# Patient Record
Sex: Female | Born: 1955 | Race: Black or African American | Hispanic: No | Marital: Married | Smoking: Never smoker
Health system: Southern US, Community
[De-identification: ages and names within clinical notes are randomized; demographics above are authoritative.]

## PROBLEM LIST (undated history)

## (undated) DIAGNOSIS — M791 Myalgia, unspecified site: Secondary | ICD-10-CM

## (undated) HISTORY — PX: KNEE SURGERY: SHX244

## (undated) HISTORY — DX: Myalgia, unspecified site: M79.10

---

## 2000-02-19 ENCOUNTER — Encounter (HOSPITAL_BASED_OUTPATIENT_CLINIC_OR_DEPARTMENT_OTHER): Payer: Self-pay | Admitting: Internal Medicine

## 2000-02-19 ENCOUNTER — Encounter: Admission: RE | Admit: 2000-02-19 | Discharge: 2000-02-19 | Payer: Self-pay

## 2002-11-19 ENCOUNTER — Inpatient Hospital Stay (HOSPITAL_COMMUNITY): Admission: EM | Admit: 2002-11-19 | Discharge: 2002-11-22 | Payer: Self-pay | Admitting: Emergency Medicine

## 2003-04-12 ENCOUNTER — Emergency Department (HOSPITAL_COMMUNITY): Admission: EM | Admit: 2003-04-12 | Discharge: 2003-04-12 | Payer: Self-pay | Admitting: Family Medicine

## 2004-04-14 ENCOUNTER — Emergency Department (HOSPITAL_COMMUNITY): Admission: EM | Admit: 2004-04-14 | Discharge: 2004-04-14 | Payer: Self-pay | Admitting: Emergency Medicine

## 2007-11-26 ENCOUNTER — Encounter: Admission: RE | Admit: 2007-11-26 | Discharge: 2007-11-26 | Payer: Self-pay | Admitting: General Surgery

## 2007-11-29 ENCOUNTER — Ambulatory Visit (HOSPITAL_BASED_OUTPATIENT_CLINIC_OR_DEPARTMENT_OTHER): Admission: RE | Admit: 2007-11-29 | Discharge: 2007-11-29 | Payer: Self-pay | Admitting: General Surgery

## 2007-11-29 ENCOUNTER — Encounter (INDEPENDENT_AMBULATORY_CARE_PROVIDER_SITE_OTHER): Payer: Self-pay | Admitting: General Surgery

## 2007-12-06 ENCOUNTER — Emergency Department (HOSPITAL_COMMUNITY): Admission: EM | Admit: 2007-12-06 | Discharge: 2007-12-06 | Payer: Self-pay | Admitting: Emergency Medicine

## 2008-01-18 ENCOUNTER — Encounter: Admission: RE | Admit: 2008-01-18 | Discharge: 2008-01-18 | Payer: Self-pay | Admitting: Rheumatology

## 2010-05-21 NOTE — Op Note (Signed)
Belinda Dougherty, Belinda Dougherty NO.:  [id number]   MEDICAL RECORD NO.:  [id number]          PATIENT TYPE:  AMB   LOCATION:  [facility]                         FACILITY:  [facility]   PHYSICIAN:  Anselm Pancoast. Weatherly, M.D.DATE OF BIRTH:  June 24, 1955   DATE OF PROCEDURE:  11/29/2007  DATE OF DISCHARGE:                               OPERATIVE REPORT   PREOPERATIVE DIAGNOSES:  Diffuse muscle weakness with elevated CPK.  Muscle biopsy recommended.   POSTOPERATIVE DIAGNOSES:  Diffuse muscle weakness with elevated CPK.  Muscle biopsy recommended.   OPERATION:  Right muscle biopsy of right quadriceps, right thigh.   General anesthesia.   HISTORY:  Belinda Dougherty is a 55 year old moderately overweight female who  was referred to me by Vania Rea. Jarold Motto, MD, Clementeen Graham, FACP, FAGA and  Pollyann Savoy, M.D. for a right muscle biopsy.  I saw the patient  back in late August.  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  This has been a problem that has progressed over the  last  6 months or so, kind of chronic pain and progressive muscle weakness.  She had marked elevated CPK and had been worked up by the neurologist  and Pollyann Savoy, M.D. with inconclusive diagnosis.  A muscle biopsy  was recommended and the patient desired to wait until this time since  she had just started school and she wanted to wait so she could be off  Thanksgiving week to miss as little work as possible.  Michael L.  Reynolds, M.D. had recommended that the right quadriceps be the muscle  biopsied because of findings on the leg EMG and she is here today for  the planned procedure.  There has been no actual rapid progression of  her disease.   Her laboratory studies, I think electrolytes on a CBC were repeated and  there was nothing significantly abnormal.   The patient was taken to the operative suite and no antibiotics were  given and of course we had marked the right leg.  The leg itself was  prepped with Betadine surgical scrub and solution and draped in a  sterile manner.  I made a little incision about 2 inches or slightly  longer in length.  Sharp dissection down through the adipose tissue and  the Scarpa fascia and the fascia overlying the quadriceps and then the  muscle itself looks grossly normal.  She has certainly got a mild  atrophy of the thigh and I picked up of a piece of the muscle inferior.  I had put a 2-0 Vicryl suture distally for hemostasis and then using a  Kelly transected that with scissors and then elevated a significant,  probably an inch wide and 3/4 of an inch in thickness and then elevated  it and then transected it up proximally and then placed some 4-0 Vicryl  and 3-0 Vicryl sutures after the muscle had been excised.  I did not use  any local anesthesia prior to doing the muscle biopsy and then  hemostasis, which was not all that excessive bleeding was sutured with  the  4-0 Vicryl and then the fascia was closed over it with interrupted 4-  0 Vicryl.  The Scarpa fascia also with 4-0 Vicryl.  I then anesthetized  everything with a total about 20 mL of 0.5% plain Marcaine.  The wound  itself fell together and I used subcutaneous 4-0 Vicryl sutures and then  1/2-inch Steri-Strips with Benzoin on the skin incision itself.   The patient will be released after a short stay.  She is going to keep  her ice bag on the leg and minimal activity for the next day or two, but  hopefully she will be able to return to work next week.  The muscle  biopsy, I think is going to be sent to [facility] according to their  protocol and the patient is aware that we probably will not have any  results back for at least probably 10-14 days.           ______________________________  Anselm Pancoast. Zachery Dakins, M.D.     WJW/MEDQ  D:  11/29/2007  T:  11/29/2007  Job:  [id number]   cc:   Vania Rea. Jarold Motto, MD, FACG, FACP, FAGA  [address]  [place]  [place] [address]   Pollyann Savoy, M.D.  Fax: [phone]

## 2010-05-24 NOTE — Discharge Summary (Signed)
NAME:  PINA, SIRIANNI                           ACCOUNT NO.:  [id number]   MEDICAL RECORD NO.:  [id number]                   PATIENT TYPE:  INP   LOCATION:  5005                                 FACILITY:  [facility]   PHYSICIAN:  Madlyn Frankel. Charlann Boxer, M.D.               DATE OF BIRTH:  03/07/1955   DATE OF ADMISSION:  11/19/2002  DATE OF DISCHARGE:  11/22/2002                                 DISCHARGE SUMMARY   ADMISSION DIAGNOSES:  Status post fall and left knee dislocation.   DISCHARGE DIAGNOSES:  Status post fall with left knee dislocation with torn  acromioclavicular ligament, medial patellofemoral ligament VMO sprain to  lateral collateral ligament.   PROCEDURE:  None.   CONSULTATIONS:  Vascular consultation for ABI.   BRIEF HISTORY:  The patient is a 55 year old black female who was at the  grocery store and she slipped and basically collapsed underneath the fall.  She did not fall directly or involved in an trauma, just collapsed. She had  immediate deformity of her leg and onset of pain. She was subsequently  transferred to [facility] emergency department  for  evaluation. On presentation she had the same complaint of deformity and  pain. X-rays were obtained and she was noted to have an anterior dislocation  of her left knee. The patient subsequently underwent reduction of her left  knee in the emergency department  and was admitted to [facility] for observation.   LABORATORY DATA:  CBC on admission showed a hemoglobin of 13.2, hematocrit  40.5, white blood cell count 6.2, red blood cell count 4.92. Coagulation  study on admission showed PTT slightly low at 25. Routine chemistry on  admission showed glucose slightly high at 122.   EKG on admission showed normal sinus rhythm with sinus arrhythmia. There are  no x-rays on the chart at this time.   HOSPITAL COURSE:  The patient was admitted to [facility]  status post closed  reduction of the left knee anterior dislocation in the  emergency department. She subsequently underwent ABIs which appear to be  normal. She underwent an MRI of her left knee which showed complete tears of  the anterior and posterior cruciate ligaments at the femoral attachments,  sprain of the fibular collateral ligament, and avulsion of the fibular head  at the site of the insertion of the SCM. Also noted were tears of the medial  patellofemoral ligament and vastus medialis obliquus. Dr. Charlann Boxer discussed her  case with Dr. Benny Lennert who will be taking over the care of this  patient. The patient is to be discharged home on November 22, 2002, and  follow up in the office with Dr. Thomasena Edis.   DISPOSITION:  The patient is discharged home on November 22, 2002.   DISCHARGE MEDICATIONS:  1. Enteric-coated aspirin one p.o. daily.  2. Vicodin, #50, one to  two p.o. q.4-6h. p.r.n. pain.   DIET:  As tolerated.   ACTIVITY:  The patient will be up as tolerated, left knee in immobilizer at  all times.   FOLLOWUP:  The patient is to follow up with Dr. Thomasena Edis in his clinic on  Thursday or Friday of this week. She is to call the office for an  appointment.   CONDITION ON DISCHARGE:  Stable and improved.      Clarene Reamer, P.A.-C.                   Madlyn Frankel Charlann Boxer, M.D.    SW/MEDQ  D:  11/22/2002  T:  11/22/2002  Job:  [id number]

## 2010-05-24 NOTE — H&P (Signed)
NAME:  Belinda Dougherty, Belinda Dougherty NO.:  [id number]   MEDICAL RECORD NO.:  [id number]                   PATIENT TYPE:  EMS   LOCATION:  [facility]                                 FACILITY:  [facility]   PHYSICIAN:  Madlyn Frankel. Charlann Boxer, M.D.               DATE OF BIRTH:  November 16, 1955   DATE OF ADMISSION:  11/19/2002  DATE OF DISCHARGE:                                HISTORY & PHYSICAL   DISCHARGE DIAGNOSIS:  Left knee dislocation.   HISTORY OF PRESENT ILLNESS:  The patient is a very pleasant 55 year old  black female who was at the grocery store when she slipped and basically  collapsed underneath the fall, did not fall directly onto any bony  prominence, just collapsed.  She had immediate deformity of her leg and  onset of pain and was subsequently transferred to [facility] for  emergency room evaluation.  On presentation, she had the same complaint of  deformity and pain.  X-rays were obtained and she was noted to have an  anterior dislocation.  Orthopedics was consulted.   Upon evaluation in the emergency room, the patient was noted to have pain  with mobility of the left lower extremity.  She was noted to have a palpable  pulse that was symmetric to her right side.  She had intact sensibility  throughout her distributions in her lower extremities and demonstrated the  ankle dorsiflexion, plantar flexion, eversion and inversion.  Upon this  initial evaluation under conscious sedation, in the emergency room, her left  knee was relocated.  Post reduction, the patient had retained bounding  palpable pulse symmetric to her right side.  Postoperative radiographs were  obtained.   PAST MEDICAL HISTORY:  Negative.   PAST SURGICAL HISTORY:  Negative.   SOCIAL HISTORY:  She works as a Runner, broadcasting/film/video and denies alcohol and tobacco use.  He has a son who was a Psychologist, occupational at [employer] fourth year looking to  go into cardiology.   REVIEW OF SYSTEMS:  The patient has been  healthy over the past two weeks  with no recent illness with medical treatment.   PRIMARY CARE PHYSICIAN:  Dr. Jarold Motto.   PHYSICAL EXAMINATION:  GENERAL:  Upon evaluation, the patient was awake,  alert, oriented, and cooperative.  HEENT:  Pupils were equal, round, and reactive.  CHEST:  Normal breath sounds with normal heart sounds.  ABDOMEN:  Soft and nontender.  NEUROLOGY:  Negative for any significant findings.  EXTREMITIES:  There was no other injury to the upper or lower extremities  other than the left lower extremity.  Evaluation of the left lower extremity  reveals deformity, obvious deformity of the knee compared to the right.  She  had some shortening of the lower extremity.  She had a palpable pulse that  was equal to her right side.  She has intact sensibility to her left lower  extremity compared to the right.  She had motor strength that was intact  other than pain.  Comparison of the right lower extremity revealed a normal  finding with palpable pulse and normal knee motion.  No abnormal findings  there.  Note that further left lower extremity examination was deferred  secondary to her discomfort at this time and will be reviewed later.   RADIOGRAPHS:  AP and lateral portable radiographs were obtained in the  emergency room and revealed anterior dislocation with proximal fibula  fracture.   ASSESSMENT:  Left knee anterior dislocation.   PLAN:  Upon initial evaluation in the emergency room, the patient and her  husband were talked to regarding her current condition.  The potential  complications and problems associated with the knee dislocation reviewed  with them including and most importantly popliteal artery injury and deep  venous thrombosis.  The incisional issue was discussed about multiple  ligament knee injury.  After discussing this and the risks of relocation and  the possible vascular injury at that time.  She consents for conscious  sedation and  reduction for the left knee.   Conscious sedation and left knee reduction was done without complications.  She had a symmetrically palpable pulse in the left lower extremity compared  to the right.  She had intact sensibility and had immediate relief of the  pain.  Postoperative radiographs were obtained.   The patient is going to be admitted for vascular observation with initially  q.1h.  She is also going to have vascular noninvasive ABI's performed to  rule out necessity for arteriogram.  Also we placed her on DVT prophylaxis.  She will also be obtaining an MRI of the left knee to rule out ligament  injury.  If she has multiple ligament injury including PCL and  posterolateral ___________, it would be best to handle this in a rather  acute phase.  Will speak to my partners regarding further care of this on an  inpatient and outpatient basis.  Admission will be at least for 24 to 48  hours to get this cleared up.                                                Madlyn Frankel Charlann Boxer, M.D.    MDO/MEDQ  D:  11/19/2002  T:  11/19/2002  Job:  [id number]

## 2010-10-08 LAB — CBC
HCT: 43.1
Hemoglobin: 13.9
MCHC: 32.2
MCV: 85.9
Platelets: 279
RBC: 5.03
RDW: 12.9
WBC: 5.3

## 2010-10-08 LAB — DIFFERENTIAL
Basophils Absolute: 0
Basophils Relative: 1
Eosinophils Absolute: 0.1
Eosinophils Relative: 2
Lymphocytes Relative: 28
Lymphs Abs: 1.5
Monocytes Absolute: 0.4
Monocytes Relative: 7
Neutro Abs: 3.3
Neutrophils Relative %: 62

## 2010-10-08 LAB — COMPREHENSIVE METABOLIC PANEL
ALT: 28
AST: 39 — ABNORMAL HIGH
Albumin: 3.7
Alkaline Phosphatase: 114
BUN: 5 — ABNORMAL LOW
CO2: 30
Calcium: 9.7
Chloride: 105
Creatinine, Ser: 0.85
GFR calc Af Amer: >60
GFR calc non Af Amer: >60
Glucose, Bld: 88
Potassium: 5
Sodium: 140
Total Bilirubin: 0.6
Total Protein: 6.9

## 2013-09-14 ENCOUNTER — Other Ambulatory Visit: Payer: Self-pay | Admitting: Internal Medicine

## 2013-09-14 ENCOUNTER — Encounter: Payer: Self-pay | Admitting: Internal Medicine

## 2013-09-14 DIAGNOSIS — Z1231 Encounter for screening mammogram for malignant neoplasm of breast: Secondary | ICD-10-CM

## 2013-10-11 ENCOUNTER — Ambulatory Visit
Admission: RE | Admit: 2013-10-11 | Discharge: 2013-10-11 | Disposition: A | Discharge status: Home / Self Care | Payer: Federal, State, Local not specified - PPO | Source: Ambulatory Visit | Attending: Internal Medicine | Admitting: Internal Medicine

## 2013-10-11 ENCOUNTER — Encounter (INDEPENDENT_AMBULATORY_CARE_PROVIDER_SITE_OTHER): Payer: Self-pay

## 2013-10-11 DIAGNOSIS — Z1231 Encounter for screening mammogram for malignant neoplasm of breast: Secondary | ICD-10-CM

## 2013-10-25 ENCOUNTER — Ambulatory Visit (AMBULATORY_SURGERY_CENTER): Payer: Self-pay

## 2013-10-25 VITALS — Ht 63.0 in | Wt 203.0 lb

## 2013-10-25 DIAGNOSIS — Z1211 Encounter for screening for malignant neoplasm of colon: Secondary | ICD-10-CM

## 2013-10-25 NOTE — Progress Notes (Signed)
Per pt, no allergies to soy or egg products.Pt not taking any weight loss meds or using  O2 at home.    Pt states she had a colonoscopy 4-5 years ago, but does not remember where it was done or the doctors name.She was unsure of the results.

## 2013-11-04 ENCOUNTER — Encounter: Payer: Self-pay | Admitting: Internal Medicine

## 2013-11-04 ENCOUNTER — Ambulatory Visit (AMBULATORY_SURGERY_CENTER): Payer: Federal, State, Local not specified - PPO | Admitting: Internal Medicine

## 2013-11-04 VITALS — BP 115/70 | HR 89 | Temp 98.6°F | Resp 19 | Ht 63.0 in | Wt 203.0 lb

## 2013-11-04 DIAGNOSIS — Z1211 Encounter for screening for malignant neoplasm of colon: Secondary | ICD-10-CM

## 2013-11-04 MED ORDER — SODIUM CHLORIDE 0.9 % IV SOLN: 500.0000 mL | INTRAVENOUS | Status: DC

## 2013-11-04 NOTE — Progress Notes (Signed)
Report to PACU, RN, vss, BBS= Clear.  

## 2013-11-04 NOTE — Op Note (Signed)
[facility] [address] [place], [id number]   COLONOSCOPY PROCEDURE REPORT  PATIENT: Belinda CoverYourse, Barri E  MR#: [id number] BIRTHDATE: 18-Aug-1955 , 58  yrs. old GENDER: female ENDOSCOPIST: Hart Carwinora M Cohen Boettner, MD REFERRED BM:[id number]:Ravisankar Avva, M.D. PROCEDURE DATE:  11/04/2013 PROCEDURE:   Colonoscopy, screening First Screening Colonoscopy - Avg.  risk and is 50 yrs.  old or older - No.  Prior Negative Screening - Now for repeat screening. N/A  History of Adenoma - Now for follow-up colonoscopy & has been > or = to 3 yrs.  N/A  Polyps Removed Today? No.  Polyps Removed Today? No.  Recommend repeat exam, <10 yrs? Polyps Removed Today? No.  Recommend repeat exam, <10 yrs? No. ASA CLASS:   Class I INDICATIONS:average risk for colon cancer and prior colonoscopy elsewhere no records about 5 years ago. MEDICATIONS: Monitored anesthesia care and Propofol 240 mg IV,Robinul .2 mg IV  DESCRIPTION OF PROCEDURE:   After the risks benefits and alternatives of the procedure were thoroughly explained, informed consent was obtained.  The digital rectal exam revealed no abnormalities of the rectum.   The LB [id number]-H190 [id number]  endoscope was introduced through the anus and advanced to the cecum, which was identified by both the appendix and ileocecal valve. No adverse events experienced.   The quality of the prep was good, using MoviPrep  The instrument was then slowly withdrawn as the colon was fully examined.      COLON FINDINGS: A normal appearing cecum, ileocecal valve, and appendiceal orifice were identified.  The ascending, transverse, descending, sigmoid colon, and rectum appeared unremarkable. Retroflexed views revealed no abnormalities. The time to cecum=9 minutes 02 seconds.  Withdrawal time=6 minutes 06 seconds.  The scope was withdrawn and the procedure completed. COMPLICATIONS: There were no immediate complications.  ENDOSCOPIC IMPRESSION: Normal  colonoscopy  RECOMMENDATIONS: High fiber diet Recall colonoscopy in 10 years  eSigned:  Hart Carwinora M Myka Hitz, MD 11/04/2013 12:39 PM   cc:

## 2013-11-04 NOTE — Patient Instructions (Addendum)
YOU HAD AN ENDOSCOPIC PROCEDURE TODAY AT THE [facility]: Refer to the procedure report that was given to you for any specific questions about what was found during the examination.  If the procedure report does not answer your questions, please call your gastroenterologist to clarify.  If you requested that your care partner not be given the details of your procedure findings, then the procedure report has been included in a sealed envelope for you to review at your convenience later.  YOU SHOULD EXPECT: Some feelings of bloating in the abdomen. Passage of more gas than usual.  Walking can help get rid of the air that was put into your GI tract during the procedure and reduce the bloating. If you had a lower endoscopy (such as a colonoscopy or flexible sigmoidoscopy) you may notice spotting of blood in your stool or on the toilet paper. If you underwent a bowel prep for your procedure, then you may not have a normal bowel movement for a few days.  DIET: Your first meal following the procedure should be a light meal and then it is ok to progress to your normal diet.  A half-sandwich or bowl of soup is an example of a good first meal.  Heavy or fried foods are harder to digest and may make you feel nauseous or bloated.  Likewise meals heavy in dairy and vegetables can cause extra gas to form and this can also increase the bloating.  Drink plenty of fluids but you should avoid alcoholic beverages for 24 hours.  ACTIVITY: Your care partner should take you home directly after the procedure.  You should plan to take it easy, moving slowly for the rest of the day.  You can resume normal activity the day after the procedure however you should NOT DRIVE or use heavy machinery for 24 hours (because of the sedation medicines used during the test).    SYMPTOMS TO REPORT IMMEDIATELY: A gastroenterologist can be reached at any hour.  During normal business hours, 8:30 AM to 5:00 PM Monday through Friday,  call [phone].  After hours and on weekends, please call the GI answering service at [phone] who will take a message and have the physician on call contact you.   Following lower endoscopy (colonoscopy or flexible sigmoidoscopy):  Excessive amounts of blood in the stool  Significant tenderness or worsening of abdominal pains  Swelling of the abdomen that is new, acute  Fever of 100F or higher  FOLLOW UP: If any biopsies were taken you will be contacted by phone or by letter within the next 1-3 weeks.  Call your gastroenterologist if you have not heard about the biopsies in 3 weeks.  Our staff will call the home number listed on your records the next business day following your procedure to check on you and address any questions or concerns that you may have at that time regarding the information given to you following your procedure. This is a courtesy call and so if there is no answer at the home number and we have not heard from you through the emergency physician on call, we will assume that you have returned to your regular daily activities without incident.  SIGNATURES/CONFIDENTIALITY: You and/or your care partner have signed paperwork which will be entered into your electronic medical record.  These signatures attest to the fact that that the information above on your After Visit Summary has been reviewed and is understood.  Full responsibility of the confidentiality of this   discharge information lies with you and/or your care-partner.  Normal colonoscopy.  High fiber diet recommended.  Recall colonoscopy in 10 years.  

## 2013-11-07 ENCOUNTER — Telehealth: Payer: Self-pay

## 2013-11-07 NOTE — Telephone Encounter (Signed)
  Follow up Call-  Call back number 11/04/2013  Post procedure Call Back phone  # [phone]  Permission to leave phone message Yes     Patient questions:  Do you have a fever, pain , or abdominal swelling? No. Pain Score  0 *  Have you tolerated food without any problems? Yes.    Have you been able to return to your normal activities? Yes.    Do you have any questions about your discharge instructions: Diet   No. Medications  No. Follow up visit  No.  Do you have questions or concerns about your Care? No.  Actions: * If pain score is 4 or above: No action needed, pain <4.

## 2016-09-22 DIAGNOSIS — Z0000 Encounter for general adult medical examination without abnormal findings: Secondary | ICD-10-CM | POA: Diagnosis not present

## 2016-09-22 DIAGNOSIS — E784 Other hyperlipidemia: Secondary | ICD-10-CM | POA: Diagnosis not present

## 2016-10-03 DIAGNOSIS — E784 Other hyperlipidemia: Secondary | ICD-10-CM | POA: Diagnosis not present

## 2016-10-03 DIAGNOSIS — Z1389 Encounter for screening for other disorder: Secondary | ICD-10-CM | POA: Diagnosis not present

## 2016-10-03 DIAGNOSIS — Z23 Encounter for immunization: Secondary | ICD-10-CM | POA: Diagnosis not present

## 2016-10-03 DIAGNOSIS — M3320 Polymyositis, organ involvement unspecified: Secondary | ICD-10-CM | POA: Diagnosis not present

## 2016-10-03 DIAGNOSIS — Z0000 Encounter for general adult medical examination without abnormal findings: Secondary | ICD-10-CM | POA: Diagnosis not present

## 2016-10-23 ENCOUNTER — Other Ambulatory Visit: Payer: Self-pay | Admitting: Internal Medicine

## 2016-10-23 DIAGNOSIS — Z1231 Encounter for screening mammogram for malignant neoplasm of breast: Secondary | ICD-10-CM

## 2016-12-16 ENCOUNTER — Ambulatory Visit: Payer: Federal, State, Local not specified - PPO

## 2017-01-14 ENCOUNTER — Ambulatory Visit
Admission: RE | Admit: 2017-01-14 | Discharge: 2017-01-14 | Disposition: A | Discharge status: Home / Self Care | Payer: Federal, State, Local not specified - PPO | Source: Ambulatory Visit | Attending: Internal Medicine | Admitting: Internal Medicine

## 2017-01-14 DIAGNOSIS — Z1231 Encounter for screening mammogram for malignant neoplasm of breast: Secondary | ICD-10-CM

## 2017-08-10 DIAGNOSIS — Z6834 Body mass index (BMI) 34.0-34.9, adult: Secondary | ICD-10-CM | POA: Diagnosis not present

## 2017-08-10 DIAGNOSIS — B029 Zoster without complications: Secondary | ICD-10-CM | POA: Diagnosis not present

## 2017-09-25 DIAGNOSIS — Z6834 Body mass index (BMI) 34.0-34.9, adult: Secondary | ICD-10-CM | POA: Diagnosis not present

## 2017-09-25 DIAGNOSIS — R030 Elevated blood-pressure reading, without diagnosis of hypertension: Secondary | ICD-10-CM | POA: Diagnosis not present

## 2017-09-25 DIAGNOSIS — R21 Rash and other nonspecific skin eruption: Secondary | ICD-10-CM | POA: Diagnosis not present

## 2017-10-02 DIAGNOSIS — R21 Rash and other nonspecific skin eruption: Secondary | ICD-10-CM | POA: Diagnosis not present

## 2017-10-02 DIAGNOSIS — L308 Other specified dermatitis: Secondary | ICD-10-CM | POA: Diagnosis not present

## 2017-10-14 DIAGNOSIS — Z0000 Encounter for general adult medical examination without abnormal findings: Secondary | ICD-10-CM | POA: Diagnosis not present

## 2017-10-14 DIAGNOSIS — R82998 Other abnormal findings in urine: Secondary | ICD-10-CM | POA: Diagnosis not present

## 2017-10-14 DIAGNOSIS — E785 Hyperlipidemia, unspecified: Secondary | ICD-10-CM | POA: Diagnosis not present

## 2017-10-19 DIAGNOSIS — Z23 Encounter for immunization: Secondary | ICD-10-CM | POA: Diagnosis not present

## 2017-10-19 DIAGNOSIS — R030 Elevated blood-pressure reading, without diagnosis of hypertension: Secondary | ICD-10-CM | POA: Diagnosis not present

## 2017-10-19 DIAGNOSIS — R21 Rash and other nonspecific skin eruption: Secondary | ICD-10-CM | POA: Diagnosis not present

## 2017-10-19 DIAGNOSIS — E7849 Other hyperlipidemia: Secondary | ICD-10-CM | POA: Diagnosis not present

## 2017-10-19 DIAGNOSIS — Z0000 Encounter for general adult medical examination without abnormal findings: Secondary | ICD-10-CM | POA: Diagnosis not present

## 2017-10-19 DIAGNOSIS — E668 Other obesity: Secondary | ICD-10-CM | POA: Diagnosis not present

## 2017-10-19 DIAGNOSIS — Z1389 Encounter for screening for other disorder: Secondary | ICD-10-CM | POA: Diagnosis not present

## 2017-10-27 ENCOUNTER — Other Ambulatory Visit: Payer: Self-pay

## 2017-10-27 ENCOUNTER — Encounter (HOSPITAL_COMMUNITY): Payer: Self-pay

## 2017-10-27 ENCOUNTER — Emergency Department (HOSPITAL_COMMUNITY)
Admission: EM | Admit: 2017-10-27 | Discharge: 2017-10-27 | Disposition: A | Discharge status: Home / Self Care | Payer: Federal, State, Local not specified - PPO | Attending: Emergency Medicine | Admitting: Emergency Medicine

## 2017-10-27 DIAGNOSIS — R21 Rash and other nonspecific skin eruption: Secondary | ICD-10-CM | POA: Diagnosis not present

## 2017-10-27 DIAGNOSIS — Z79899 Other long term (current) drug therapy: Secondary | ICD-10-CM | POA: Insufficient documentation

## 2017-10-27 MED ORDER — FAMOTIDINE 20 MG PO TABS: 20.0000 mg | ORAL_TABLET | Freq: Two times a day (BID) | ORAL | 0 refills | Status: AC

## 2017-10-27 NOTE — ED Provider Notes (Signed)
[facility]-EMERGENCY DEPT Provider Note   CSN: [id number] Arrival date & time: 10/27/17  1316     History   Chief Complaint Chief Complaint  Patient presents with  . Rash    HPI Belinda Dougherty is a 62 y.o. female.  HPI  Patient is a 62 year old female with no significant past history presents emergency department today for evaluation of a diffuse, erythematous, itchy/painful rash to her arms, legs and torso that has been present for the last 2 months.  Patient states that she has been evaluated by her PCP as well as dermatology for this rash.  Initially the rash was thought to be secondary to shingles however biopsy was taken of the rash her dermatologist confirmed this was not shingles.  She has tried calamine lotion, Benadryl cream, hydrocortisone cream, systemic steroids, Zyrtec, and Benadryl for the itching.  She has also been prescribed a sleep aid by her PCP due to the constant itching.  States she has a skin patch testing with her dermatologist next week to further evaluate the rash.  Denies any lip swelling, tongue swelling, throat swelling, difficulty breathing, wheezing or chest pain.  States she is used on scented soaps and lotions.  Denies any changes in environment, new medications or foods.  Past Medical History:  Diagnosis Date  . Pain in the muscles    arms,neck and shoulders, knee pain    There are no active problems to display for this patient.   Past Surgical History:  Procedure Laterality Date  . KNEE SURGERY     right knee     OB History   None      Home Medications    Prior to Admission medications   Medication Sig Start Date End Date Taking? Authorizing Provider  bisacodyl (BISACODYL) 5 MG EC tablet Take 5 mg by mouth. Dulcolax tabs( bowel prep)-take as directed    [provider]  famotidine (PEPCID) 20 MG tablet Take 1 tablet (20 mg total) by mouth 2 (two) times daily for 7 days. 10/27/17 11/03/17  Diesha Rostad  S, PA-C  polyethylene glycol powder (MIRALAX) powder Take 1 Container by mouth once. Miralax 238 gms bowel prep-take as directed    [provider]  traMADol (ULTRAM-ER) 200 MG 24 hr tablet Take 200 mg by mouth 2 (two) times daily.    [provider]    Family History Family History  Problem Relation Age of Onset  . Breast cancer Sister     Social History Social History   Tobacco Use  . Smoking status: Never Smoker  . Smokeless tobacco: Never Used  Substance Use Topics  . Alcohol use: No  . Drug use: No     Allergies   Patient has no known allergies.   Review of Systems Review of Systems  Constitutional: Negative for fever.  HENT:       No angioedema  Eyes: Negative for visual disturbance.  Respiratory: Negative for shortness of breath and wheezing.   Cardiovascular: Negative for chest pain.  Gastrointestinal: Negative for abdominal pain.  Genitourinary: Negative for pelvic pain.  Musculoskeletal: Negative for back pain.  Skin: Positive for rash.    Physical Exam Updated Vital Signs BP (!) 145/94 (BP Location: Left Arm)   Pulse 99   Temp 99.2 F (37.3 C) (Oral)   Resp 16   Ht 5\' 3"  (1.6 m)   Wt 85.9 kg   SpO2 98%   BMI 33.55 kg/m   Physical Exam  Constitutional: She appears well-developed and well-nourished. No distress.  HENT:  Head: Normocephalic and atraumatic.  No angioedema.  Normal voice, tolerating secretions.  Eyes: Conjunctivae are normal.  Neck: Neck supple.  Cardiovascular: Normal rate and regular rhythm.  Pulmonary/Chest: Effort normal and breath sounds normal. She has no wheezes.  Musculoskeletal: Normal range of motion.  Neurological: She is alert.  Skin: Skin is warm and dry.  Diffuse maculopapular erythematous rash to the trunk, arms legs and buttocks.  Slight vesicular appearance to rash on bilateral buttocks.  No evidence of superinfection or abscesses.  Psychiatric: She has a normal mood and affect.  Nursing note  and vitals reviewed.    ED Treatments / Results  Labs (all labs ordered are listed, but only abnormal results are displayed) Labs Reviewed - No data to display  EKG None  Radiology No results found.  Procedures Procedures (including critical care time)  Medications Ordered in ED Medications - No data to display   Initial Impression / Assessment and Plan / ED Course  I have reviewed the triage vital signs and the nursing notes.  Pertinent labs & imaging results that were available during my care of the patient were reviewed by me and considered in my medical decision making (see chart for details).     Final Clinical Impressions(s) / ED Diagnoses   Final diagnoses:  Rash   Unclear etiology of rash, but could represent hives versus contact dermatitis. Patient denies any difficulty breathing or swallowing.  Pt has a patent airway without stridor and is handling secretions without difficulty; no angioedema. No blisters, no pustules, no warmth, no draining sinus tracts, no superficial abscesses, no bullous impetigo, no vesicles, no desquamation, no target lesions with dusky purpura or a central bulla. No concern for superimposed infection. No concern for SJS, TEN, TSS, tick borne illness, syphilis or other life-threatening condition.  Patient is currently following with her PCP and with dermatology for said rash.  She has tried multiple interventions for her symptoms with no significant relief.  I offered IM steroids and steroid taper for her symptoms, but she declines stating that this medication did not help her in the past.  She is currently on Zyrtec, Benadryl and has tried multiple creams at home with no significant relief.  Offered Rx for Pepcid. Advised her to call her dermatologist for f/u and to see if they have any other recommendations. Return precautions discussed. Pt voices understanding of plan and reasons to return. All questions answered.    ED Discharge Orders          Ordered    famotidine (PEPCID) 20 MG tablet  2 times daily     10/27/17 [address] [place], PA-C 10/27/17 1623    Raeford Razor, MD 10/27/17 1718

## 2017-10-27 NOTE — ED Notes (Signed)
Patient verbalized understanding of discharge instructions, no questions. Patient ambulated out of ED with steady gait in no distress.  

## 2017-10-27 NOTE — ED Triage Notes (Signed)
Pt states that she has a rash on her chest "for a while". Pt has reddened, raised areas on her abdomen, back, and buttocks. Pt states the rash has been since August.

## 2017-10-27 NOTE — Discharge Instructions (Signed)
Take medication as prescribed.   Call your dermatologist to make an earlier appointment if possible. Return to the emergency room if you have new or worsening symptoms.

## 2017-11-03 DIAGNOSIS — B86 Scabies: Secondary | ICD-10-CM | POA: Diagnosis not present

## 2018-01-27 DIAGNOSIS — L308 Other specified dermatitis: Secondary | ICD-10-CM | POA: Diagnosis not present

## 2018-07-11 IMAGING — MG DIGITAL SCREENING BILATERAL MAMMOGRAM WITH CAD
5 series · 5 of 5 positions shown · non-contrast
Comparison: Previous exam(s).

CLINICAL DATA: Screening.

EXAM:
DIGITAL SCREENING BILATERAL MAMMOGRAM WITH CAD

[L CC (1 of 2)]
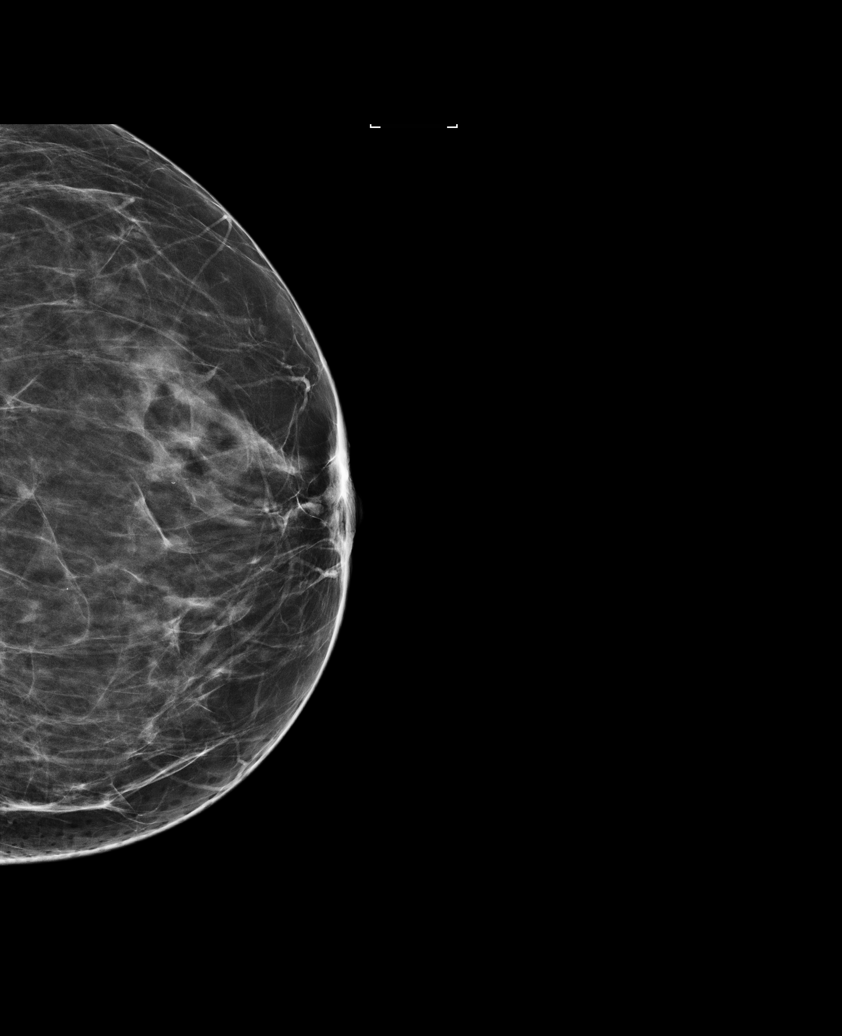

[R MLO]
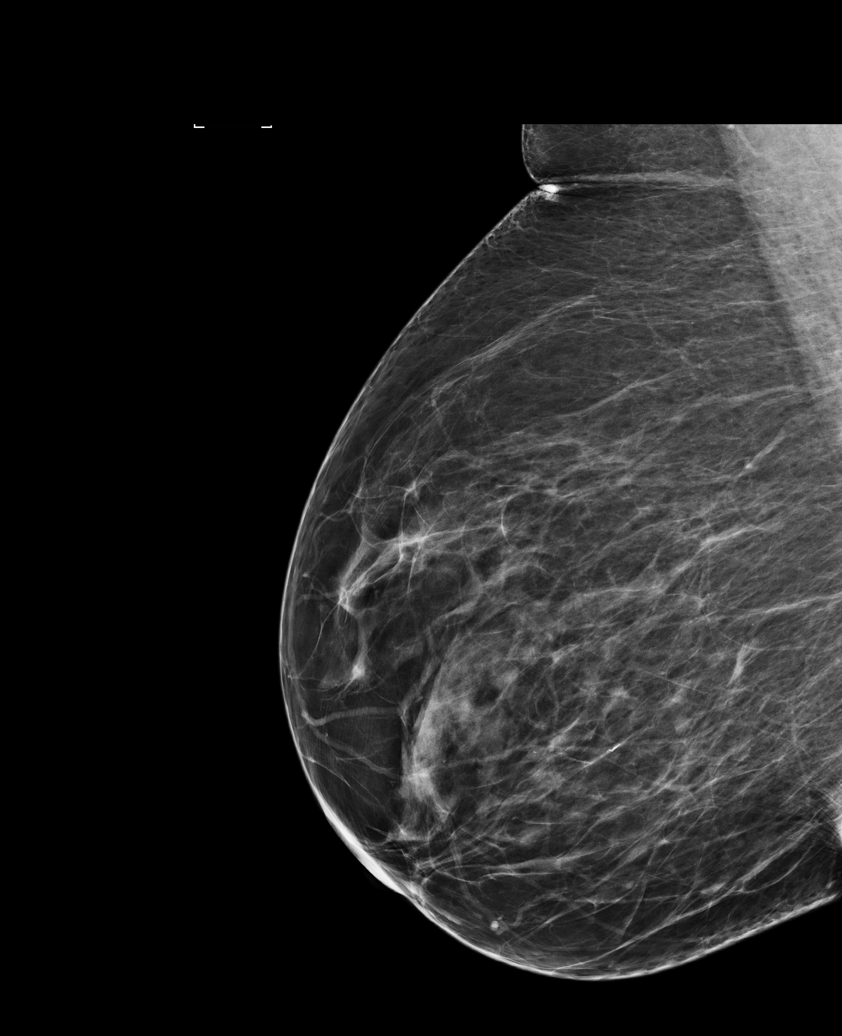

[L MLO]
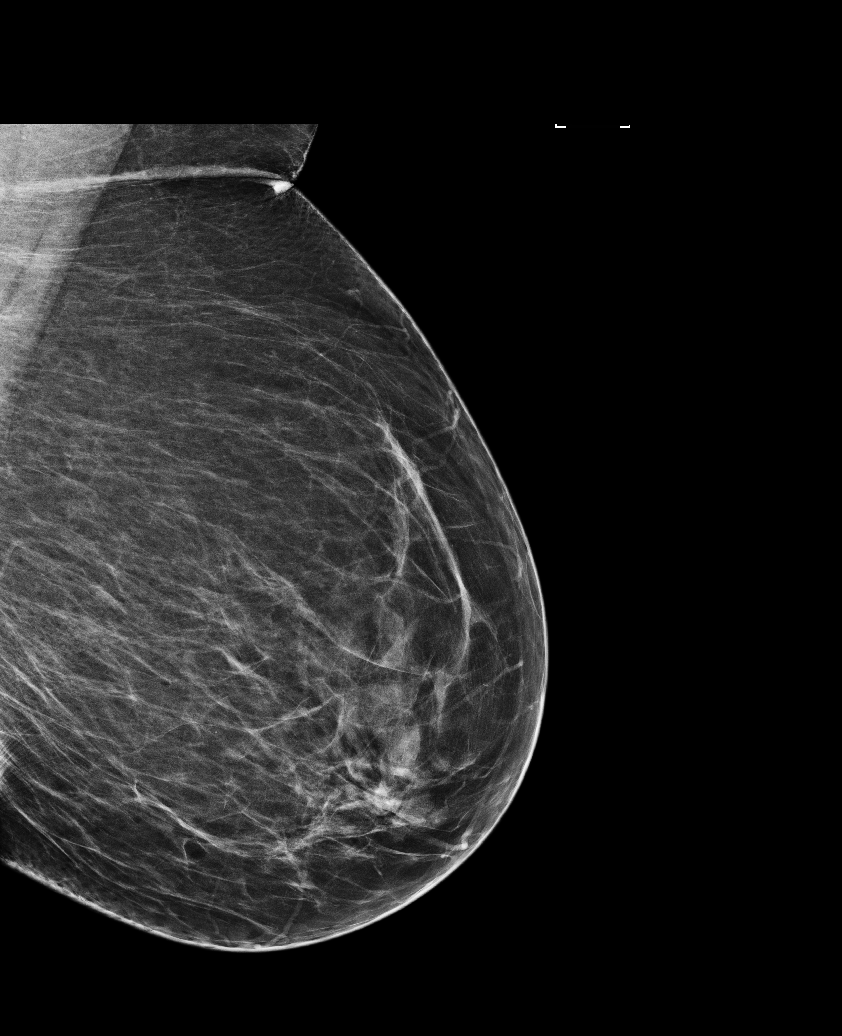

[R CC]
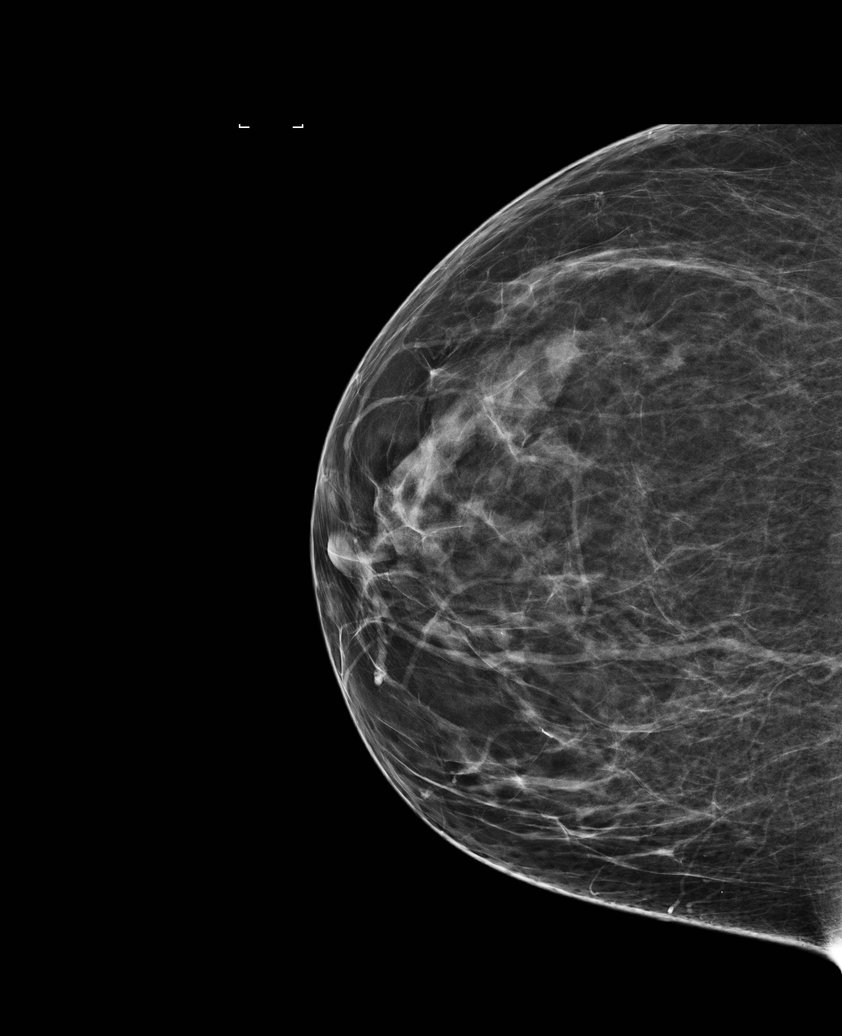

[L CC (2 of 2)]
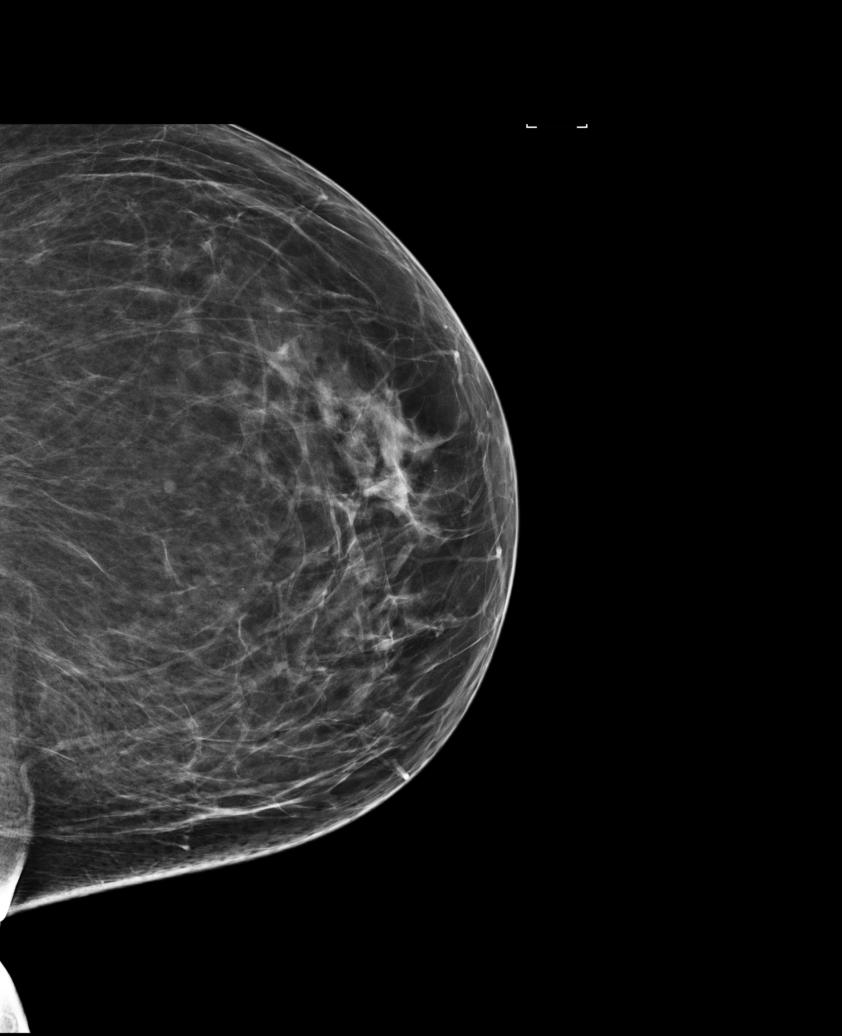

[5 of 5 positions shown; findings below may reference images not displayed]

ACR Breast Density Category b: There are scattered areas of
fibroglandular density.
FINDINGS: There are no findings suspicious for malignancy. Images were
processed with CAD.
IMPRESSION: No mammographic evidence of malignancy. A result letter of this
screening mammogram will be mailed directly to the patient.

RECOMMENDATION:
Screening mammogram in one year. (Code:AS-G-LCT)

BI-RADS CATEGORY  1: Negative.

## 2019-01-05 ENCOUNTER — Ambulatory Visit: Payer: Federal, State, Local not specified - PPO | Attending: Internal Medicine

## 2019-01-05 DIAGNOSIS — Z20822 Contact with and (suspected) exposure to covid-19: Secondary | ICD-10-CM

## 2019-01-06 LAB — NOVEL CORONAVIRUS, NAA: SARS-CoV-2, NAA: NOT DETECTED

## 2019-09-29 ENCOUNTER — Other Ambulatory Visit: Payer: Self-pay | Admitting: Internal Medicine

## 2019-09-29 DIAGNOSIS — U071 COVID-19: Secondary | ICD-10-CM

## 2019-09-29 NOTE — Progress Notes (Signed)
I connected by phone with Belinda Dougherty on 09/29/2019 at 2:43 PM to discuss the potential use of a new treatment for mild to moderate COVID-19 viral infection in non-hospitalized patients.  This patient is a 64 y.o. female that meets the FDA criteria for Emergency Use Authorization of COVID monoclonal antibody casirivimab/imdevimab.  Has a (+) direct SARS-CoV-2 viral test result  Has mild or moderate COVID-19   Is NOT hospitalized due to COVID-19  Is within 10 days of symptom onset  Has at least one of the high risk factor(s) for progression to severe COVID-19 and/or hospitalization as defined in EUA.  Specific high risk criteria : Older age (>/= 66 yo)   I have spoken and communicated the following to the patient or parent/caregiver regarding COVID monoclonal antibody treatment:  1. FDA has authorized the emergency use for the treatment of mild to moderate COVID-19 in adults and pediatric patients with positive results of direct SARS-CoV-2 viral testing who are 14 years of age and older weighing at least 40 kg, and who are at high risk for progressing to severe COVID-19 and/or hospitalization.  2. The significant known and potential risks and benefits of COVID monoclonal antibody, and the extent to which such potential risks and benefits are unknown.  3. Information on available alternative treatments and the risks and benefits of those alternatives, including clinical trials.  4. Patients treated with COVID monoclonal antibody should continue to self-isolate and use infection control measures (e.g., wear mask, isolate, social distance, avoid sharing personal items, clean and disinfect "high touch" surfaces, and frequent handwashing) according to CDC guidelines.   5. The patient or parent/caregiver has the option to accept or refuse COVID monoclonal antibody treatment.  After reviewing this information with the patient, The patient agreed to proceed with receiving casirivimab\imdevimab  infusion and will be provided a copy of the Fact sheet prior to receiving the infusion.  Cyndee Brightly, NP [facility]

## 2019-09-30 ENCOUNTER — Other Ambulatory Visit (HOSPITAL_COMMUNITY): Payer: Self-pay

## 2019-09-30 ENCOUNTER — Ambulatory Visit (HOSPITAL_COMMUNITY)
Admission: RE | Admit: 2019-09-30 | Discharge: 2019-09-30 | Disposition: A | Discharge status: Home / Self Care | Payer: Federal, State, Local not specified - PPO | Source: Ambulatory Visit | Attending: Pulmonary Disease | Admitting: Pulmonary Disease

## 2019-09-30 DIAGNOSIS — U071 COVID-19: Secondary | ICD-10-CM | POA: Diagnosis not present

## 2019-09-30 MED ORDER — SODIUM CHLORIDE 0.9 % IV SOLN: INTRAVENOUS | Status: DC | PRN

## 2019-09-30 MED ORDER — SODIUM CHLORIDE 0.9 % IV SOLN
1200.0000 mg | Freq: Once | INTRAVENOUS | Status: AC
  Administered 2019-09-30: 1200 mg via INTRAVENOUS

## 2019-09-30 MED ORDER — ALBUTEROL SULFATE HFA 108 (90 BASE) MCG/ACT IN AERS: 2.0000 | INHALATION_SPRAY | Freq: Once | RESPIRATORY_TRACT | Status: DC | PRN

## 2019-09-30 MED ORDER — EPINEPHRINE 0.3 MG/0.3ML IJ SOAJ: 0.3000 mg | Freq: Once | INTRAMUSCULAR | Status: DC | PRN

## 2019-09-30 MED ORDER — FAMOTIDINE IN NACL 20-0.9 MG/50ML-% IV SOLN: 20.0000 mg | Freq: Once | INTRAVENOUS | Status: DC | PRN

## 2019-09-30 MED ORDER — DIPHENHYDRAMINE HCL 50 MG/ML IJ SOLN: 50.0000 mg | Freq: Once | INTRAMUSCULAR | Status: DC | PRN

## 2019-09-30 MED ORDER — METHYLPREDNISOLONE SODIUM SUCC 125 MG IJ SOLR: 125.0000 mg | Freq: Once | INTRAMUSCULAR | Status: DC | PRN

## 2019-09-30 NOTE — Progress Notes (Signed)
  Diagnosis: COVID-19  Physician: Dr. Wright  Procedure: [facility] Med: casirivimab\imdevimab infusion - Provided patient with casirivimab\imdevimab fact sheet for patients, parents and caregivers prior to infusion.  Complications: No immediate complications noted.  Discharge: Discharged home   Belinda Dougherty M [facility] 09/30/2019  

## 2019-09-30 NOTE — Discharge Instructions (Signed)

## 2019-11-16 ENCOUNTER — Other Ambulatory Visit: Payer: Self-pay | Admitting: Internal Medicine

## 2019-11-16 DIAGNOSIS — Z1231 Encounter for screening mammogram for malignant neoplasm of breast: Secondary | ICD-10-CM

## 2020-11-22 ENCOUNTER — Other Ambulatory Visit: Payer: Self-pay | Admitting: Internal Medicine

## 2020-11-22 DIAGNOSIS — Z1231 Encounter for screening mammogram for malignant neoplasm of breast: Secondary | ICD-10-CM

## 2021-08-08 ENCOUNTER — Ambulatory Visit
Admission: RE | Admit: 2021-08-08 | Discharge: 2021-08-08 | Disposition: A | Discharge status: Home / Self Care | Payer: Federal, State, Local not specified - PPO | Source: Ambulatory Visit | Attending: Internal Medicine | Admitting: Internal Medicine

## 2021-08-08 DIAGNOSIS — Z1231 Encounter for screening mammogram for malignant neoplasm of breast: Secondary | ICD-10-CM

## 2023-06-09 ENCOUNTER — Other Ambulatory Visit: Payer: Self-pay | Admitting: Internal Medicine

## 2023-06-09 DIAGNOSIS — Z1231 Encounter for screening mammogram for malignant neoplasm of breast: Secondary | ICD-10-CM

## 2023-06-10 ENCOUNTER — Ambulatory Visit
Admission: RE | Admit: 2023-06-10 | Discharge: 2023-06-10 | Disposition: A | Discharge status: Home / Self Care | Source: Ambulatory Visit | Attending: Internal Medicine | Admitting: Internal Medicine

## 2023-06-10 DIAGNOSIS — Z1231 Encounter for screening mammogram for malignant neoplasm of breast: Secondary | ICD-10-CM

## 2024-01-12 ENCOUNTER — Telehealth: Payer: Self-pay

## 2024-01-12 NOTE — Telephone Encounter (Signed)
 Attempted to reach patient concerning colonoscopy recall; unable to speak with patient;  left message and number to the office for patient to call back and schedule appts;
